# Patient Record
Sex: Female | Born: 1990 | Race: Black or African American | Hispanic: No | Marital: Single | Smoking: Former smoker
Health system: Southern US, Community
[De-identification: ages and names within clinical notes are randomized; demographics above are authoritative.]

## PROBLEM LIST (undated history)

## (undated) DIAGNOSIS — R011 Cardiac murmur, unspecified: Secondary | ICD-10-CM

---

## 2008-07-26 ENCOUNTER — Ambulatory Visit: Payer: Self-pay | Admitting: Diagnostic Radiology

## 2008-07-26 ENCOUNTER — Emergency Department (HOSPITAL_BASED_OUTPATIENT_CLINIC_OR_DEPARTMENT_OTHER): Admission: EM | Admit: 2008-07-26 | Discharge: 2008-07-26 | Payer: Self-pay | Admitting: Emergency Medicine

## 2010-05-15 IMAGING — CR DG CHEST 2V
2 series · 2 of 2 positions shown · non-contrast
Comparison: None

CLINICAL DATA: Pneumonia.  Cough.  Congestion.

CHEST - 2 VIEW

[w chest pa]
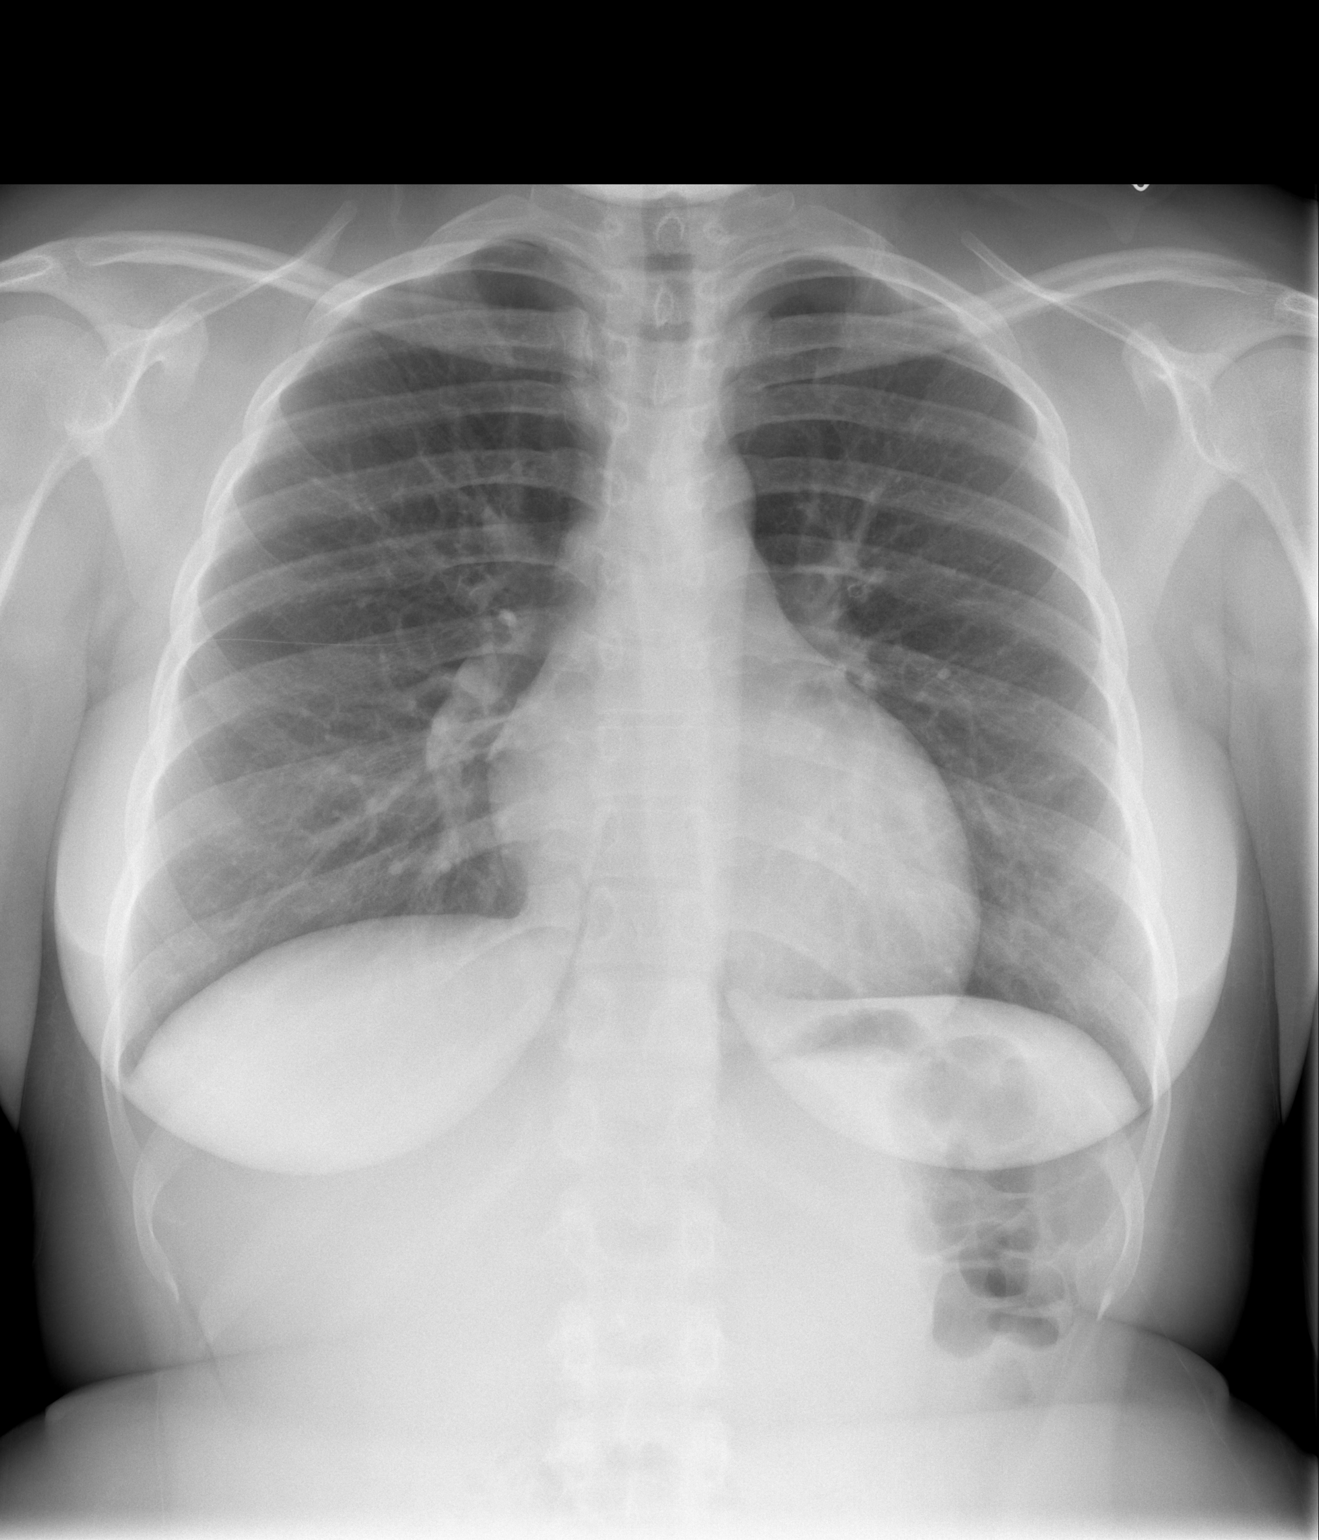

[w chest lat]
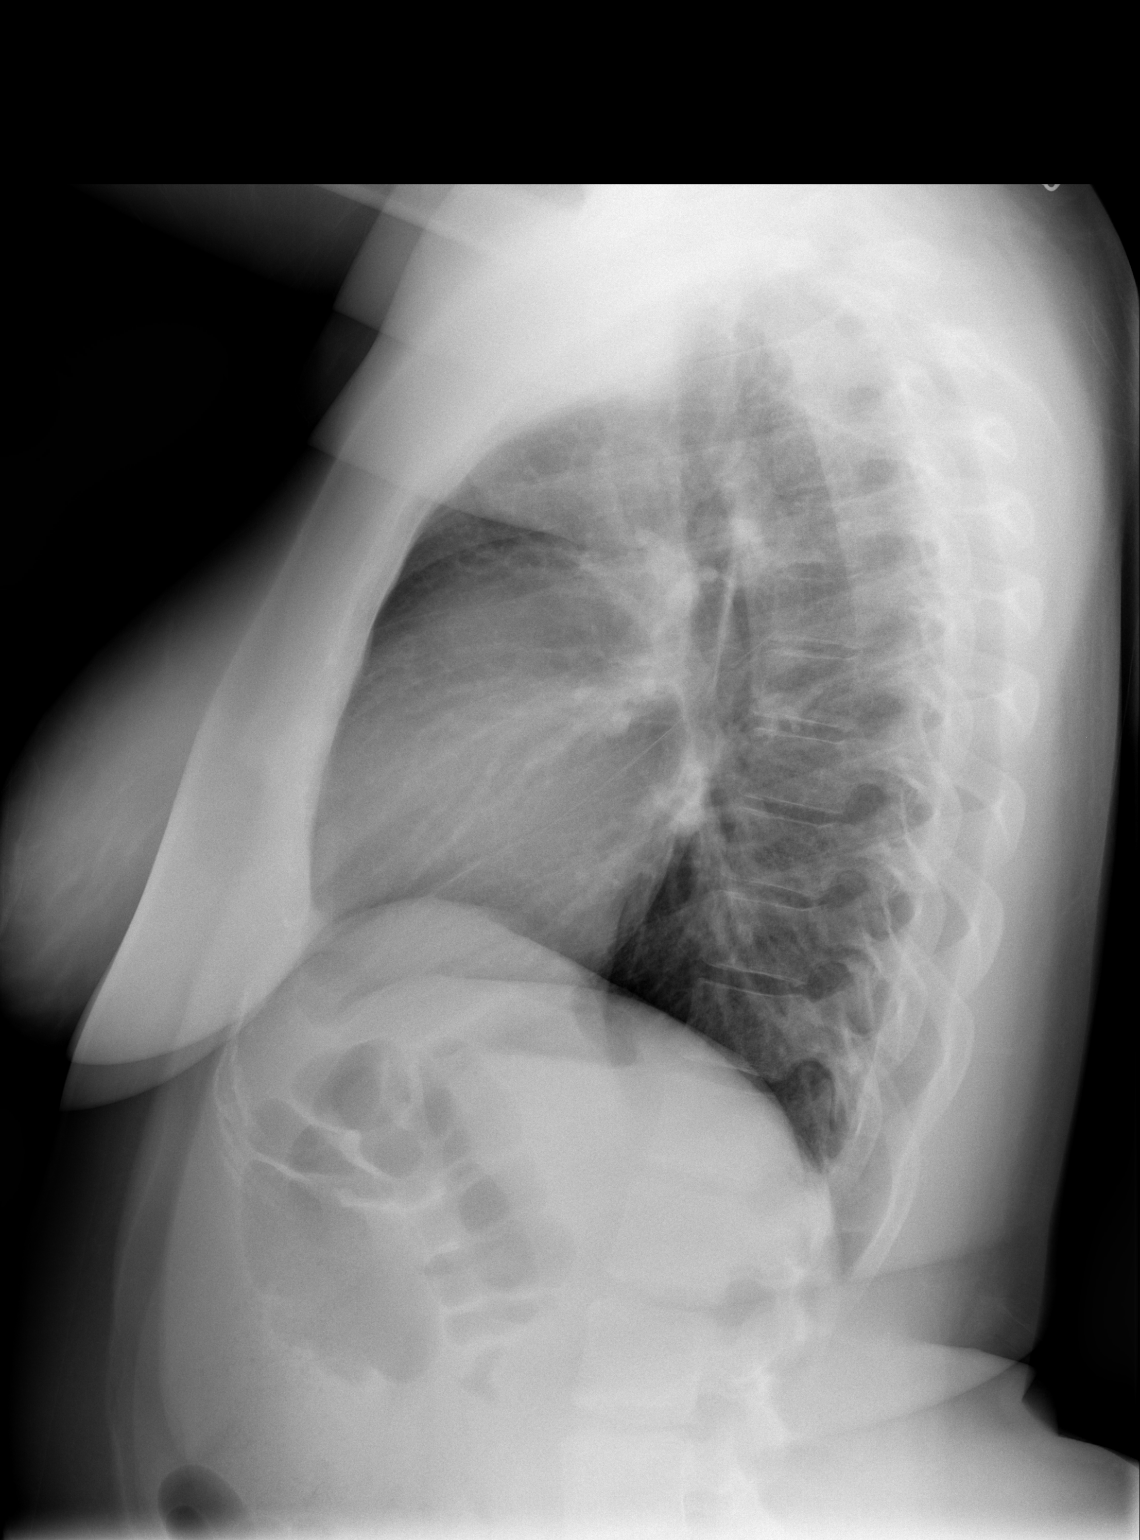

[2 of 2 positions shown; findings below may reference images not displayed]

FINDINGS: Heart size is normal.  The mediastinum is unremarkable.
Lungs are clear.  No effusions.  No soft tissue or bony
abnormality.
IMPRESSION: Normal chest

## 2011-05-11 ENCOUNTER — Emergency Department (HOSPITAL_BASED_OUTPATIENT_CLINIC_OR_DEPARTMENT_OTHER)
Admission: EM | Admit: 2011-05-11 | Discharge: 2011-05-11 | Disposition: A | Discharge status: Home / Self Care | Payer: Medicaid - Out of State | Attending: Emergency Medicine | Admitting: Emergency Medicine

## 2011-05-11 ENCOUNTER — Encounter: Payer: Self-pay | Admitting: *Deleted

## 2011-05-11 DIAGNOSIS — F17200 Nicotine dependence, unspecified, uncomplicated: Secondary | ICD-10-CM | POA: Insufficient documentation

## 2011-05-11 DIAGNOSIS — J40 Bronchitis, not specified as acute or chronic: Secondary | ICD-10-CM

## 2011-05-11 HISTORY — DX: Cardiac murmur, unspecified: R01.1

## 2011-05-11 MED ORDER — AZITHROMYCIN 250 MG PO TABS: 250.0000 mg | ORAL_TABLET | Freq: Every day | ORAL | Status: AC

## 2011-05-11 NOTE — ED Notes (Signed)
Karen Sofia, PA-C at bedside for evaluation. 

## 2011-05-11 NOTE — ED Notes (Signed)
Lightheaded, upset stomach, cold/sweats, headache for about 1 week

## 2011-05-11 NOTE — ED Provider Notes (Signed)
History     CSN: [id number] Arrival date & time: 05/11/2011 12:08 PM  Chief Complaint  Patient presents with  . URI    (Consider location/radiation/quality/duration/timing/severity/associated sxs/prior treatment) Patient is a 20 y.o. female presenting with cough. The history is provided by the patient. No language interpreter was used.  Cough This is a new problem. The current episode started more than 1 week ago. The problem occurs constantly. The problem has been gradually worsening. The cough is non-productive. There has been no fever. The fever has been present for less than 1 day. Pertinent negatives include no chest pain. She has tried nothing for the symptoms. The treatment provided moderate relief. She is not a smoker. Her past medical history does not include bronchitis or pneumonia.  Pt complains of a cough and congestion.    Past Medical History  Diagnosis Date  . Heart murmur     History reviewed. No pertinent past surgical history.  No family history on file.  History  Substance Use Topics  . Smoking status: Current Some Day Smoker  . Smokeless tobacco: Not on file  . Alcohol Use: No    OB History    Grav Para Term Preterm Abortions TAB SAB Ect Mult Living                  Review of Systems  Respiratory: Positive for cough.   Cardiovascular: Negative for chest pain.  All other systems reviewed and are negative.    Allergies  Review of patient's allergies indicates no known allergies.  Home Medications  No current outpatient prescriptions on file.  BP 106/65  Pulse 85  Temp(Src) 98.4 F (36.9 C) (Oral)  Resp 20  Ht 5\' 1"  (1.549 m)  Wt 160 lb (72.576 kg)  BMI 30.23 kg/m2  SpO2 96%  LMP 05/01/2011  Physical Exam  Nursing note and vitals reviewed. Constitutional: She appears well-developed and well-nourished.  HENT:  Head: Normocephalic and atraumatic.  Eyes: Conjunctivae and EOM are normal. Pupils are equal, round, and reactive to light.    Neck: Normal range of motion. Neck supple.  Cardiovascular: Normal rate.   Pulmonary/Chest: Effort normal.  Abdominal: Soft.  Musculoskeletal: Normal range of motion.  Neurological: She is alert.  Skin: Skin is warm.  Psychiatric: She has a normal mood and affect.    ED Course  Procedures (including critical care time)  Labs Reviewed - No data to display No results found.   No diagnosis found.    MDM  I will cover for bronchitis.        Langston Masker, [place] 05/11/11 1317

## 2011-05-11 NOTE — ED Provider Notes (Signed)
Medical screening examination/treatment/procedure(s) were performed by non-physician practitioner and as supervising physician I was immediately available for consultation/collaboration.   Forbes Cellar, MD 05/11/11 1328

## 2016-03-16 ENCOUNTER — Ambulatory Visit (HOSPITAL_COMMUNITY)
Admission: RE | Admit: 2016-03-16 | Discharge: 2016-03-16 | Disposition: A | Discharge status: Home / Self Care | Payer: Medicaid Other | Source: Ambulatory Visit | Attending: Specialist | Admitting: Specialist

## 2016-03-16 DIAGNOSIS — Z8279 Family history of other congenital malformations, deformations and chromosomal abnormalities: Secondary | ICD-10-CM

## 2016-03-16 DIAGNOSIS — Z3A13 13 weeks gestation of pregnancy: Secondary | ICD-10-CM | POA: Diagnosis not present

## 2016-03-16 DIAGNOSIS — Z315 Encounter for genetic counseling: Secondary | ICD-10-CM | POA: Diagnosis not present

## 2016-03-17 ENCOUNTER — Other Ambulatory Visit (HOSPITAL_COMMUNITY): Payer: Self-pay | Admitting: *Deleted

## 2016-03-17 DIAGNOSIS — O09299 Supervision of pregnancy with other poor reproductive or obstetric history, unspecified trimester: Secondary | ICD-10-CM

## 2016-03-20 ENCOUNTER — Encounter (HOSPITAL_COMMUNITY): Payer: Self-pay | Admitting: MS"

## 2016-03-20 DIAGNOSIS — Z8279 Family history of other congenital malformations, deformations and chromosomal abnormalities: Secondary | ICD-10-CM | POA: Insufficient documentation

## 2016-03-20 NOTE — Progress Notes (Signed)
Genetic Counseling  High-Risk Gestation Note  Appointment Date:  03/16/16 Referred By: Glory Buff, MD Date of Birth:  [birth year]   Pregnancy History: G4P3 Estimated Date of Delivery: 09/17/16 Estimated Gestational Age: 74w4dAttending: MRenella Cunas MD   I met with Raven Robinson for genetic counseling because of a previous daughter with 22q11.2 deletion syndrome (DiGeorge syndrome).  In summary:  Discussed family history of 22q11.2 deletion  Patient's 65year old daughter diagnosed with 22q11.2 deletion; s/p repair Tetralogy of fallot  Current pregnancy has different father  Ms. MHaddoxreported that she personally had normal 22q11.2 deletion testing [facility] Given this report, recurrence risk would be low  Discussed options of screening / testing  NIPS to assess for 22q11.2 deletion (approximately 60-80% detection rate)  Amniocentesis- declined  Ultrasound-scheduled for 04/20/16 at [facility]  Fetal echocardiogram-patient would like to pursue this; will be scheduled at later date  Discussed general population carrier screening options (CF, SMA, hemoglobinopathies)- declined  We began by reviewing the family history in detail. The patient reported that her daughter, MAlver Robinson has DiGeorge syndrome. She is currently 27years old and was diagnosed postnatally with tetralogy of fallot. She had surgical correction at 369months of age. 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 Ms. MSouthgatereported also having testing [facility] 22[facility]11.2 [facility] and this testing was within normal limits.   We reviewed chromosomes and genes. 22q11.2 deletion syndrome, also called DiGeorge syndrome or Velocardiofacial syndrome is caused by the deletion of genes on  one end of chromosome 22 (the q11.2 region of the chromosome). Common features of this condition include heart defects, cleft palate, characteristic facial features, immune deficiency, and learning disabilities. (More specifically, approximately 20% of individuals with 22q11.2 deletion syndrome are estimated to have autism/autism spectrum disorders.) Less commonly, individuals with DiGeorge syndrome may also have an autoimmune disease, growth hormone deficiency, hearing loss, and psychiatric illness. Symptoms may vary significantly from person to person, even among relatives.    Approximately 93% of individuals with DiGeorge syndrome have a de novo (occurring for the first time in that individual) deletion of the 22q11.2 region, and approximately 7% of individuals inherited the deletion from one parent who also carries the deletion of the 22q11.2 region. DiGeorge syndrome is an autosomal dominant condition, meaning once a person has the condition they have a 50% (1 in 2) chance with each pregnancy to pass on the chromosome 22 that has the deletion and also have a child with the condition. Given [name]'s report that she has had normal 22q11.2 deletion testing, recurrence risk for the current pregnancy would be expected to be low but not zero given that gonadal mosaicism or low level somatic mosaicism cannot be ruled out for the patient.   Regarding prenatal diagnostic options, we discussed that prenatal testing for 22q11.2 deletion syndrome could also be done on cultured amniocytes using FISH (Fluorescent in-situ hybridization) to detect a deletion of 22q11.2, as this deletion is not typically detected on routine chromosome analysis. We reviewed the risks, benefits, and limitations of amniocentesis, including the associated 1 in 3032-122risk for complications, including spontaneous pregnancy loss. Regarding screening options, we discussed noninvasive prenatal screening (NIPS)/prenatal cell free DNA  testing for chromosome aneuploidy. We reviewed the methodology of this screen,  the conditions for which it assesses, and the detection rates of each. Specifically, we discussed that certain NIPS platforms can screen for 22q11.2 deletion, and the detection rate is reported to range from 60-80%. The detection rates can be dependent upon the fetal fraction of cell free fragments in the sample and the size of the deletion, if present. The patient understands that NIPS is not considered diagnostic for these conditions. We also discussed the screening options of detailed ultrasound in the second trimester and fetal echocardiogram to assess fetal heart in detail. While certain features of 92E10.0 deletion syndrome may be visualized on ultrasound, the presence of absence of these possible features would not diagnose or rule out this condition in a pregnancy. We discussed the possible results that the tests might provide including: positive, negative, unanticipated, and no result. Finally, they were counseled regarding the cost of each option and potential out of pocket expenses.  After reviewing these options, Raven Robinson elected to have NIPS for aneuploidy and 22q11.2 deletion (Panorama through [facility] [place]), but declined amniocentesis. She elected to pursue detailed ultrasound and fetal echocardiogram in the second trimester. Detailed ultrasound is scheduled for 04/20/16 in our office. Fetal echocardiogram will be facilitated at a later date. She understands that ultrasound cannot rule out all birth defects or genetic syndromes. The patient was advised of this limitation and states she still does not want diagnostic testing at this time.    The family histories were otherwise found to be contributory for a maternal half-uncle to the patient (her mother's maternal half-brother) with intellectual disability. This relative is 27 years old and babbles but does not have developed words. He does not walk and was  not described to have dysmorphic features. His delays were attributed to having lots of seizures as a baby. No additional relatives were reported with similar features. Ms. Stanczak was counseled that there are many different causes of intellectual disabilities including environmental, multifactorial, and genetic etiologies.  We discussed that a specific diagnosis for intellectual disability can be determined in approximately 50% of these individuals.  In the remaining 50% of individuals, a diagnosis may never be determined.  Regarding genetic causes, we discussed that chromosome aberrations (aneuploidy, deletions, duplications, insertions, and translocations) are responsible for a small percentage of individuals with intellectual disability.  Many individuals with chromosome aberrations have additional differences, including congenital anomalies or minor dysmorphisms.  Likewise, single gene conditions are the underlying cause of intellectual delay in some families.  We discussed that many gene conditions have intellectual disability as a feature, but also often include other physical or medical differences.  Specifically, we reviewed fragile X syndrome and the X-linked inheritance of this condition.  We discussed the option of FMR1 (the gene that when altered causes fragile X syndrome) analysis to determine whether Fragile X syndrome is the cause of intellectual disability in this family.  Ms. Sharisa JAYAH BALTHAZAR declined FMR1 analysis today.  We discussed that without more specific information, it is difficult to provide an accurate risk assessment.    Ms. Mehan also reported two distant cousins to herself with sickle cell trait. We reviewed the autosomal recessive inheritance of sickle cell anemia and other hemoglobinopathies. Given the reported degree of relation, Ms. Trant's chance to have sickle cell trait based on the reported family history is approximately 1 in 2, which is less than the general population  rate. Screening for hemoglobin variants is available to the patient via hemoglobin electrophoresis and complete blood count. She declined  carrier screening for sickle cell trait and other hemoglobin variants at this time. Further genetic counseling is warranted if more information is obtained.  Ms. Mikalah J Mirante was provided with written information regarding cystic fibrosis (CF), spinal muscular atrophy (SMA) and hemoglobinopathies including the carrier frequency, availability of carrier screening and prenatal diagnosis if indicated.  In addition, we discussed that CF and hemoglobinopathies are routinely screened for as part of the [facility] newborn screening panel.  After further discussion, she declined screening for CF, SMA and hemoglobinopathies.  Ms. Renisha OMARI KOSLOSKY denied exposure to environmental toxins or chemical agents. She denied the use of alcohol, tobacco or street drugs. She denied significant viral illnesses during the course of her pregnancy. Her medical and surgical histories were noncontributory.   I counseled Ms. Samara J Kroner regarding the above risks and available options.  The approximate face-to-face time with the genetic counselor was 40 minutes.  Chipper Oman, MS Certified Genetic Counselor 03/20/2016

## 2016-03-22 ENCOUNTER — Telehealth (HOSPITAL_COMMUNITY): Payer: Self-pay | Admitting: Genetics

## 2016-03-22 ENCOUNTER — Other Ambulatory Visit (HOSPITAL_COMMUNITY): Payer: Self-pay

## 2016-03-22 NOTE — Telephone Encounter (Signed)
Called Conna J Hollis to discuss her prenatal cell free DNA test results.  Mrs. Meta J Lonon had [facility] testing [facility].  Testing was offered because of a previous pregnancy with 22q11 deletion syndrome.   The patient was identified by name and DOB.  We reviewed that these are within normal limits, showing a less than 1 in 10,000 risk for trisomies 21, 18 and 13, and monosomy X (Turner syndrome).  In addition, the risk for triploidy/vanishing twin and sex chromosome trisomies (47,XXX and 47,XXY) was also low risk.  Mrs. Rogelio SeenMcCall elected to have cfDNA analysis for 22q11 deletion syndrome, which was also low risk (<1 in [id number]).  We reviewed that this testing identifies > 99% of pregnancies with trisomy 1421, trisomy 6613, sex chromosome trisomies (47,XXX and 47,XXY), and triploidy. The detection rate for trisomy 18 is 96%.  The detection rate for monosomy X is ~92%.  The false positive rate is <0.1% for all conditions. Testing was also consistent with female fetal sex.  The patient did wish to know fetal sex.  She understands that this testing does not identify all genetic conditions.  All questions were answered to her satisfaction, she was encouraged to call with additional questions or concerns.  Mady Gemmaaragh Kaedan Richert, MS Certified Genetic Counselor

## 2016-04-20 ENCOUNTER — Encounter (HOSPITAL_COMMUNITY): Payer: Self-pay

## 2016-04-20 ENCOUNTER — Ambulatory Visit (HOSPITAL_COMMUNITY)
Admission: RE | Admit: 2016-04-20 | Discharge: 2016-04-20 | Disposition: A | Discharge status: Home / Self Care | Payer: Medicaid Other | Source: Ambulatory Visit | Attending: Specialist | Admitting: Specialist

## 2016-04-20 DIAGNOSIS — Z3A18 18 weeks gestation of pregnancy: Secondary | ICD-10-CM | POA: Diagnosis not present

## 2016-04-20 DIAGNOSIS — O09292 Supervision of pregnancy with other poor reproductive or obstetric history, second trimester: Secondary | ICD-10-CM | POA: Diagnosis not present

## 2016-04-20 DIAGNOSIS — Z36 Encounter for antenatal screening of mother: Secondary | ICD-10-CM | POA: Diagnosis not present

## 2016-04-20 DIAGNOSIS — O09299 Supervision of pregnancy with other poor reproductive or obstetric history, unspecified trimester: Secondary | ICD-10-CM

## 2016-04-20 DIAGNOSIS — O34219 Maternal care for unspecified type scar from previous cesarean delivery: Secondary | ICD-10-CM | POA: Insufficient documentation

## 2017-01-19 ENCOUNTER — Encounter (HOSPITAL_COMMUNITY): Payer: Self-pay
